# Patient Record
Sex: Female | Born: 1990
Health system: Southern US, Community
[De-identification: ages and names within clinical notes are randomized; demographics above are authoritative.]

---

## 2017-03-07 ENCOUNTER — Encounter (HOSPITAL_COMMUNITY): Payer: Self-pay | Admitting: Emergency Medicine

## 2017-03-07 ENCOUNTER — Emergency Department (HOSPITAL_COMMUNITY)
Admission: EM | Admit: 2017-03-07 | Discharge: 2017-03-07 | Disposition: A | Discharge status: Home / Self Care | Payer: Self-pay | Attending: Emergency Medicine | Admitting: Emergency Medicine

## 2017-03-07 DIAGNOSIS — J029 Acute pharyngitis, unspecified: Secondary | ICD-10-CM | POA: Insufficient documentation

## 2017-03-07 LAB — RAPID STREP SCREEN (MED CTR MEBANE ONLY): STREPTOCOCCUS, GROUP A SCREEN (DIRECT): NEGATIVE

## 2017-03-07 MED ORDER — DEXAMETHASONE 1 MG/ML PO CONC
10.0000 mg | Freq: Once | ORAL | Status: DC
  Filled 2017-03-07: qty 10

## 2017-03-07 MED ORDER — DEXAMETHASONE 4 MG PO TABS
10.0000 mg | ORAL_TABLET | Freq: Once | ORAL | Status: AC
  Administered 2017-03-07: 10 mg via ORAL
  Filled 2017-03-07: qty 3

## 2017-03-07 MED ORDER — PENICILLIN G BENZATHINE & PROC 1200000 UNIT/2ML IM SUSP: 1.2000 10*6.[IU] | Freq: Once | INTRAMUSCULAR | Status: DC

## 2017-03-07 MED ORDER — PENICILLIN G BENZATHINE 1200000 UNIT/2ML IM SUSP
1.2000 10*6.[IU] | Freq: Once | INTRAMUSCULAR | Status: AC
  Administered 2017-03-07: 1.2 10*6.[IU] via INTRAMUSCULAR
  Filled 2017-03-07: qty 2

## 2017-03-07 NOTE — ED Triage Notes (Signed)
Pt reports waking up with severely swollen tonsils this am, denies pain but states it is hard to breathe and talk.

## 2017-03-07 NOTE — ED Notes (Signed)
Pt is in stable condition upon d/c and ambulates from ED. 

## 2017-03-07 NOTE — ED Notes (Signed)
Pt is requesting crackers and ginger ale. Trey PaulaJeff, PA notified will switch from liquid med to pill rt liquid not arriving from pharmacy.

## 2017-03-07 NOTE — Discharge Instructions (Signed)
Please read attached information. If you experience any new or worsening signs or symptoms please return to the emergency room for evaluation. Please follow-up with your primary care provider or specialist as discussed.  °

## 2017-03-07 NOTE — ED Provider Notes (Signed)
[facility]-EMERGENCY DEPT Provider Note   CSN: [id number] Arrival date & time: 03/07/17  1001     History   Chief Complaint Chief Complaint  Patient presents with  . Sore Throat    HPI Jaime Walker is a 26 y.o. female.  HPI   26 year old female presents today with complaints of sore throat. Patient notes her last 4 days she's had sore throat with painful swallowing. She notes on day 1 she had a fever and was taking Tylenol Cold and flu. She notes the fever went away, symptoms started improved and had very minimal pain yesterday. She notes waking up this morning with extreme pain in her throat, painful swallowing. She denies any drooling or pooling of secretions, she does note a minor change in her voice. She denies any fever today. Patient notes that she's had minor upper respiratory congestion associated with sore throat, with no significant productive cough. Patient notes when she was younger she had reoccurring strep throat infections, but has not had any in the last year. She reports she is otherwise healthy with no significant history.  History reviewed. No pertinent past medical history.  There are no active problems to display for this patient.   History reviewed. No pertinent surgical history.  OB History    No data available       Home Medications    Prior to Admission medications   Medication Sig Start Date End Date Taking? Authorizing Provider  Phenylephrine-DM-GG-APAP (TYLENOL COLD/FLU SEVERE) 5-10-200-325 MG TABS Take 1 tablet by mouth 2 (two) times daily as needed. Flu symptoms   Yes [provider]    Family History No family history on file.  Social History Social History  Substance Use Topics  . Smoking status: Not on file  . Smokeless tobacco: Not on file  . Alcohol use Not on file     Allergies   Patient has no known allergies.   Review of Systems Review of Systems  All other systems reviewed and are negative.    Physical  Exam Updated Vital Signs BP 113/71   Pulse 79   Temp 98.5 F (36.9 C) (Oral)   Resp 14   LMP 02/21/2017 (Exact Date)   SpO2 98%   Physical Exam  Constitutional: She is oriented to person, place, and time. She appears well-developed and well-nourished.  HENT:  Head: Normocephalic and atraumatic.  Bilateral tonsillar swelling; symmetrical, no significant exudate- uvula is midline with no swelling rises with phonation, no signs of PTA or RPA. No pooling of secretions, no swelling to the tongue remainder of oral cavity  Eyes: Conjunctivae are normal. Pupils are equal, round, and reactive to light. Right eye exhibits no discharge. Left eye exhibits no discharge. No scleral icterus.  Neck: Normal range of motion. No JVD present. No tracheal deviation present.  Neck is supple for active range of motion, no palpable cervical lymphadenopathy  Pulmonary/Chest: Effort normal. No stridor.  Neurological: She is alert and oriented to person, place, and time. Coordination normal.  Psychiatric: She has a normal mood and affect. Her behavior is normal. Judgment and thought content normal.  Nursing note and vitals reviewed.    ED Treatments / Results  Labs (all labs ordered are listed, but only abnormal results are displayed) Labs Reviewed  RAPID STREP SCREEN (NOT AT [facility])  CULTURE, GROUP A STREP [facility])    EKG  EKG Interpretation None       Radiology No results found.  Procedures Procedures (including critical care time)  Medications Ordered in ED Medications  penicillin g benzathine (BICILLIN LA) 1200000 UNIT/2ML injection 1.2 Million Units (1.2 Million Units Intramuscular Given 03/07/17 1140)  dexamethasone (DECADRON) tablet 10 mg (10 mg Oral Given 03/07/17 1140)     Initial Impression / Assessment and Plan / ED Course  I have reviewed the triage vital signs and the nursing notes.  Pertinent labs & imaging results that were available during my care of the patient were reviewed by  me and considered in my medical decision making (see chart for details).      Final Clinical Impressions(s) / ED Diagnoses   Final diagnoses:  Pharyngitis, unspecified etiology    Labs: rapid strep  Imaging:  Consults:  Therapeutics: decadron, penicillin   Discharge Meds:   Assessment/Plan: 46 female presents today with sore throat. Patient initially had improvement of symptoms with dramatic worsening. She is afebrile nontoxic this time. She has no pooling of secretions, no asymmetrical swelling of the tonsils no signs of PTA or RPA. Given patient's worsening of symptoms, significant swelling Decadron and penicillin will be used for presumed bacterial pharyngitis. Patient given strict return precautions, she verbalized understanding and agreement to today's plan had no further questions or concerns. Patient also questioning whether she needs her tonsils removed due to recurrent infections, I referred her to ENT for further discussion.    New Prescriptions Discharge Medication List as of 03/07/2017 11:55 AM       Eyvonne Mechanic, PA-C 03/07/17 1553    Cardama, Amadeo Garnet, MD 03/11/17 [phone]

## 2017-03-09 LAB — CULTURE, GROUP A STREP (THRC)

## 2018-02-16 ENCOUNTER — Other Ambulatory Visit: Payer: Self-pay

## 2018-02-16 ENCOUNTER — Encounter (HOSPITAL_BASED_OUTPATIENT_CLINIC_OR_DEPARTMENT_OTHER): Payer: Self-pay

## 2018-02-16 ENCOUNTER — Emergency Department (HOSPITAL_BASED_OUTPATIENT_CLINIC_OR_DEPARTMENT_OTHER)
Admission: EM | Admit: 2018-02-16 | Discharge: 2018-02-16 | Disposition: A | Discharge status: Home / Self Care | Payer: No Typology Code available for payment source | Attending: Emergency Medicine | Admitting: Emergency Medicine

## 2018-02-16 ENCOUNTER — Emergency Department (HOSPITAL_BASED_OUTPATIENT_CLINIC_OR_DEPARTMENT_OTHER): Payer: No Typology Code available for payment source

## 2018-02-16 DIAGNOSIS — M25511 Pain in right shoulder: Secondary | ICD-10-CM

## 2018-02-16 DIAGNOSIS — Y998 Other external cause status: Secondary | ICD-10-CM | POA: Diagnosis not present

## 2018-02-16 DIAGNOSIS — S99911A Unspecified injury of right ankle, initial encounter: Secondary | ICD-10-CM | POA: Diagnosis present

## 2018-02-16 DIAGNOSIS — Y9389 Activity, other specified: Secondary | ICD-10-CM | POA: Insufficient documentation

## 2018-02-16 DIAGNOSIS — Y92410 Unspecified street and highway as the place of occurrence of the external cause: Secondary | ICD-10-CM | POA: Insufficient documentation

## 2018-02-16 DIAGNOSIS — T148XXA Other injury of unspecified body region, initial encounter: Secondary | ICD-10-CM

## 2018-02-16 DIAGNOSIS — S46911A Strain of unspecified muscle, fascia and tendon at shoulder and upper arm level, right arm, initial encounter: Secondary | ICD-10-CM | POA: Insufficient documentation

## 2018-02-16 DIAGNOSIS — S96911A Strain of unspecified muscle and tendon at ankle and foot level, right foot, initial encounter: Secondary | ICD-10-CM | POA: Diagnosis not present

## 2018-02-16 DIAGNOSIS — F17210 Nicotine dependence, cigarettes, uncomplicated: Secondary | ICD-10-CM | POA: Diagnosis not present

## 2018-02-16 MED ORDER — METHOCARBAMOL 500 MG PO TABS: 500.0000 mg | ORAL_TABLET | Freq: Two times a day (BID) | ORAL | 0 refills | Status: AC

## 2018-02-16 MED ORDER — HYDROCODONE-ACETAMINOPHEN 5-325 MG PO TABS
2.0000 | ORAL_TABLET | Freq: Once | ORAL | Status: AC
  Administered 2018-02-16: 2 via ORAL
  Filled 2018-02-16: qty 2

## 2018-02-16 MED FILL — METHOCARBAMOL 500 MG TABLET: 500 | 10 days supply | Qty: 20 | Fill #0
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
## 2018-02-16 NOTE — ED Notes (Signed)
ED Provider at bedside. 
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
## 2023-09-20 ENCOUNTER — Encounter (HOSPITAL_BASED_OUTPATIENT_CLINIC_OR_DEPARTMENT_OTHER): Payer: Self-pay

## 2023-09-20 ENCOUNTER — Other Ambulatory Visit (HOSPITAL_BASED_OUTPATIENT_CLINIC_OR_DEPARTMENT_OTHER): Payer: Self-pay

## 2023-09-20 ENCOUNTER — Other Ambulatory Visit: Payer: Self-pay

## 2023-09-20 ENCOUNTER — Emergency Department (HOSPITAL_BASED_OUTPATIENT_CLINIC_OR_DEPARTMENT_OTHER)
Admission: EM | Admit: 2023-09-20 | Discharge: 2023-09-20 | Disposition: A | Discharge status: Home / Self Care | Payer: Medicaid Other | Attending: Emergency Medicine | Admitting: Emergency Medicine

## 2023-09-20 DIAGNOSIS — K130 Diseases of lips: Secondary | ICD-10-CM | POA: Diagnosis present

## 2023-09-20 DIAGNOSIS — B001 Herpesviral vesicular dermatitis: Secondary | ICD-10-CM

## 2023-09-20 DIAGNOSIS — R220 Localized swelling, mass and lump, head: Secondary | ICD-10-CM

## 2023-09-20 DIAGNOSIS — F17210 Nicotine dependence, cigarettes, uncomplicated: Secondary | ICD-10-CM | POA: Insufficient documentation

## 2023-09-20 MED ORDER — ACYCLOVIR 400 MG PO TABS
800.0000 mg | ORAL_TABLET | Freq: Two times a day (BID) | ORAL | 1 refills | Status: AC
  Filled 2023-09-20: qty 20, 5d supply, fill #0

## 2023-09-20 MED ORDER — METHYLPREDNISOLONE 4 MG PO TBPK
ORAL_TABLET | ORAL | 0 refills | Status: AC
  Filled 2023-09-20: qty 21, 6d supply, fill #0

## 2023-09-20 MED ORDER — CETIRIZINE HCL 10 MG PO TABS
10.0000 mg | ORAL_TABLET | Freq: Every day | ORAL | 0 refills | Status: AC
  Filled 2023-09-20: qty 30, 30d supply, fill #0

## 2023-09-20 NOTE — ED Triage Notes (Signed)
She states she has had a "sore" on her upper lip for about three days. She denies fever, nor any other sign of current illness. She mentions that about a year ago "I got such a bad cold sore that they prescribed me Valcycovir".

## 2023-09-20 NOTE — Discharge Instructions (Addendum)
You have been prescribed antiviral medication called acyclovir for your presumed cold sore.  You are also started on steroids and antihistamine.  They should help reduce the swelling to your lip.  Please follow-up with your PCP for recheck for recurrent lip swelling.  Please return for any worsening worrisome symptoms such as worsening swelling, tongue swelling, difficulty swallowing or speaking, difficulty tolerating her own secretions, rash spreading to your face, or any other worrisome or worsening symptoms.

## 2023-09-20 NOTE — ED Provider Notes (Signed)
[place] EMERGENCY DEPARTMENT AT [facility] Provider Note  CSN: [id number] Arrival date & time: 09/20/23 0827  Chief Complaint(s) upper lip lesion/swelling  HPI Jaime Walker is a 32 y.o. female with past medical history as below, significant for recurrent lip swelling, cold sores who presents to the ED with complaint of possible cold sore, lip swelling  Patient ports URI symptoms over the past few days with dry cough, rhinorrhea.  Symptoms have been improving.  She noticed a cold sore to her left upper lip, then had some swelling to her left upper lip.  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  She has not seen a doctor for this previously.  She took some leftover Valtrex on Thursday when the cold sore and lip swelling began but she only had a couple tablets left.  She is not having any tongue swelling or difficulty swallowing, no difficulty breathing or speaking.  No fevers or chills, no rashes otherwise.  Past Medical History History reviewed. No pertinent past medical history. There are no active problems to display for this patient.  Home Medication(s) Prior to Admission medications   Medication Sig Start Date End Date Taking? Authorizing Provider  acyclovir (ZOVIRAX) 800 MG tablet Take 1 tablet (800 mg total) by mouth 2 (two) times daily for 5 days. 09/20/23 09/25/23 Yes Sloan Leiter, DO  cetirizine (ZYRTEC ALLERGY) 10 MG tablet Take 1 tablet (10 mg total) by mouth daily. 09/20/23 10/20/23 Yes Sloan Leiter, DO  methylPREDNISolone (MEDROL DOSEPAK) 4 MG TBPK tablet Per manufacturer's instructions 09/20/23  Yes Sloan Leiter, DO  methocarbamol (ROBAXIN) 500 MG tablet Take 1 tablet (500 mg total) by mouth 2 (two) times daily. 02/16/18   Maxwell Caul, PA-C  Phenylephrine-DM-GG-APAP (TYLENOL COLD/FLU SEVERE) 5-10-200-325 MG TABS Take 1 tablet by mouth 2 (two) times daily as needed. Flu symptoms    [provider]                                                                                                                                    Past Surgical History History reviewed. No pertinent surgical history. Family History History reviewed. No pertinent family history.  Social History Social History   Tobacco Use   Smoking status: Every Day    Types: Cigarettes   Smokeless tobacco: Never  Substance Use Topics   Alcohol use: Yes    Comment: occ   Drug use: Never   Allergies Patient has no known allergies.  Review of Systems Review of Systems  Constitutional:  Negative for chills and fever.  HENT:  Positive for congestion and facial swelling. Negative for trouble swallowing and voice change.   Respiratory:  Positive for cough.   Cardiovascular:  Negative for chest pain and palpitations.  Gastrointestinal:  Negative for abdominal pain and vomiting.  Neurological:  Negative for speech difficulty  and headaches.  All other systems reviewed and are negative.   Physical Exam Vital Signs  I have reviewed the triage vital signs BP 131/88 (BP Location: Left Arm)   Pulse 81   Temp 98 F (36.7 C) (Oral)   Resp 15   LMP 09/18/2023 (Exact Date)   SpO2 100%  Physical Exam Vitals and nursing note reviewed.  Constitutional:      General: She is not in acute distress.    Appearance: Normal appearance. She is well-developed. She is not ill-appearing.  HENT:     Head: Normocephalic and atraumatic. No raccoon eyes, Battle's sign, right periorbital erythema or left periorbital erythema.     Jaw: There is normal jaw occlusion. No trismus or swelling.     Comments: No drooling trismus stridor    Right Ear: External ear normal.     Left Ear: External ear normal.     Nose: Nose normal.     Mouth/Throat:     Mouth: Mucous membranes are moist. No oral lesions.     Tongue: No lesions. Tongue does not deviate from midline.     Pharynx: Oropharynx is clear. No pharyngeal swelling  or uvula swelling.     Comments: Slight swelling noted to upper lip, vesicular lesion noted to left upper lip. Eyes:     General: No scleral icterus.       Right eye: No discharge.        Left eye: No discharge.  Cardiovascular:     Rate and Rhythm: Normal rate.  Pulmonary:     Effort: Pulmonary effort is normal. No respiratory distress.     Breath sounds: No stridor.  Abdominal:     General: Abdomen is flat. There is no distension.     Tenderness: There is no guarding.  Musculoskeletal:        General: No deformity.     Cervical back: No rigidity.  Skin:    General: Skin is warm and dry.     Coloration: Skin is not cyanotic, jaundiced or pale.  Neurological:     Mental Status: She is alert and oriented to person, place, and time.     GCS: GCS eye subscore is 4. GCS verbal subscore is 5. GCS motor subscore is 6.  Psychiatric:        Speech: Speech normal.        Behavior: Behavior normal. Behavior is cooperative.     ED Results and Treatments Labs (all labs ordered are listed, but only abnormal results are displayed) Labs Reviewed - No data to display                                                                                                                        Radiology No results found.  Pertinent labs & imaging results that were available during my care of the patient were reviewed by me and considered in my medical decision making (see MDM for details).  Medications Ordered in ED  Medications - No data to display                                                                                                                                   Procedures Procedures  (including critical care time)  Medical Decision Making / ED Course    Medical Decision Making:    Jaime Walker is a 32 y.o. female with past medical history as below, significant for recurrent lip swelling, cold sores who presents to the ED with complaint of possible cold sore, lip swelling.  The complaint involves an extensive differential diagnosis and also carries with it a high risk of complications and morbidity.  Serious etiology was considered. Ddx includes but is not limited to: Herpes simplex, stomatitis, angioedema, hereditary angioedema, idiopathic angioedema, viral infection, etc.  Complete initial physical exam performed, notably the patient was in no acute distress, breathing comfortably.    Reviewed and confirmed nursing documentation for past medical history, family history, social history.  Vital signs reviewed.        Brief summary: 32 year old female history of recurrent lip swelling and cold sores here with recurrent episode of lip swelling and possible cold sore.  Symptom onset on Thursday, took Valtrex and symptoms persisted.  She presents today with ongoing lip swelling and possible cold sore.  Denies formal testing for herpes simplex in the past.  Denies any rashes otherwise no concern for STI.  Denies use of home medications including ACE or ARB.  Denies any Family history with similar swelling.  She reports that lip swelling occurs typically yearly oftentimes in conjunction with cold sore.  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  She is very well-appearing, nontoxic, no evidence of airway compromise, symptoms ongoing for approximately 4 days have not worsened.  Started on acyclovir, Decadron(denies pregnancy), and antihistamine, encourage follow-up with PCP.   The patient improved significantly and was discharged in stable condition. Detailed discussions were had with the patient regarding current findings, and need for close f/u with PCP or on call doctor. The patient has been instructed to return immediately if the symptoms worsen in any way for re-evaluation. Patient verbalized understanding and is in agreement with current care plan. All questions answered prior to discharge.                  Additional  history obtained: -Additional history obtained from na -External records from outside source obtained and reviewed including: Chart review including previous notes, labs, imaging, consultation notes including  Prior ED visits, primary care documentation, prior labs and imaging Was seen in the ED 12/23 with similar presentation   Lab Tests: na  EKG   EKG Interpretation Date/Time:    Ventricular Rate:    PR Interval:    QRS Duration:    QT Interval:    QTC Calculation:   R Axis:      Text Interpretation:  Imaging Studies ordered: na   Medicines ordered and prescription drug management: Meds ordered this encounter  Medications   acyclovir (ZOVIRAX) 800 MG tablet    Sig: Take 1 tablet (800 mg total) by mouth 2 (two) times daily for 5 days.    Dispense:  10 tablet    Refill:  1   methylPREDNISolone (MEDROL DOSEPAK) 4 MG TBPK tablet    Sig: Per manufacturer's instructions    Dispense:  21 each    Refill:  0   cetirizine (ZYRTEC ALLERGY) 10 MG tablet    Sig: Take 1 tablet (10 mg total) by mouth daily.    Dispense:  30 tablet    Refill:  0    -I have reviewed the patients home medicines and have made adjustments as needed   Consultations Obtained: na   Cardiac Monitoring: Continuous pulse oximetry interpreted by myself, 100% on RA.    Social Determinants of Health:   Diagnosis or treatment significantly limited by social determinants of health: current smoker and obesity no pcp   Reevaluation: After the interventions noted above, I reevaluated the patient and found that they have stayed the same  Co morbidities that complicate the patient evaluation History reviewed. No pertinent past medical history.    Dispostion: Disposition decision including need for hospitalization was considered, and patient discharged from emergency department.    Final Clinical Impression(s) / ED Diagnoses Final diagnoses:  Recurrent cold sores  Lip swelling         Sloan Leiter, DO 09/20/23 [phone]

## 2024-09-11 ENCOUNTER — Encounter (HOSPITAL_BASED_OUTPATIENT_CLINIC_OR_DEPARTMENT_OTHER): Payer: Self-pay

## 2024-09-11 ENCOUNTER — Other Ambulatory Visit: Payer: Self-pay

## 2024-09-11 ENCOUNTER — Other Ambulatory Visit (HOSPITAL_BASED_OUTPATIENT_CLINIC_OR_DEPARTMENT_OTHER): Payer: Self-pay

## 2024-09-11 ENCOUNTER — Emergency Department (HOSPITAL_BASED_OUTPATIENT_CLINIC_OR_DEPARTMENT_OTHER)
Admission: EM | Admit: 2024-09-11 | Discharge: 2024-09-11 | Disposition: A | Discharge status: Home / Self Care | Attending: Emergency Medicine | Admitting: Emergency Medicine

## 2024-09-11 DIAGNOSIS — Z760 Encounter for issue of repeat prescription: Secondary | ICD-10-CM | POA: Insufficient documentation

## 2024-09-11 DIAGNOSIS — Z7689 Persons encountering health services in other specified circumstances: Secondary | ICD-10-CM

## 2024-09-11 LAB — PREGNANCY, URINE: Preg Test, Ur: NEGATIVE

## 2024-09-11 MED ORDER — ULIPRISTAL ACETATE 30 MG PO TABS
1.0000 | ORAL_TABLET | Freq: Once | ORAL | 0 refills | Status: AC
  Filled 2024-09-11: qty 1, 1d supply, fill #0

## 2024-09-11 NOTE — ED Triage Notes (Addendum)
 Arrives ambulatory to the ED requesting emergency contraception due to intercourse with no protection 3-4 days ago. Patient attempted to get the medication at a pharmacy, but was unable to receive it without a prescription.

## 2024-09-11 NOTE — Discharge Instructions (Addendum)
 You were seen in the emergency room for request of emergency contraception.  We have prescribed you Ella , which can be picked up at [facility].  If there is any issues with the pick up, up let us  know immediately.  Our OB/GYN team will also likely contact you to discuss ongoing contraceptive use.

## 2024-09-13 ENCOUNTER — Telehealth (HOSPITAL_BASED_OUTPATIENT_CLINIC_OR_DEPARTMENT_OTHER): Payer: Self-pay

## 2024-09-13 NOTE — Telephone Encounter (Signed)
 Left a message for patient to schedule a follow up from her ED visit. Asked her to call the office to schedule [phone].

## 2024-10-04 NOTE — ED Provider Notes (Signed)
 " [place] EMERGENCY DEPARTMENT AT [facility] Provider Note   CSN: [id number] Arrival date & time: 09/11/24  9049     Patient presents with: Emergency Contraception   Jaime Walker is a 33 y.o. female.   HPI  Pt comes in requesting plan B. Pt reports that she had intercourse 3 days ago. The protection didn't work. She is concerned that she might get pregnant and is requesting plan B. She did get over the counter plan B, but it states that the medicine might not be effective above her weight. [facility] another plan B, but when she went to her pharmacy there was no medicine ordered, and therefore she comes to the ER.     Prior to Admission medications  Medication Sig Start Date End Date Taking? Authorizing Provider  cetirizine  (ZYRTEC  ALLERGY) 10 MG tablet Take 1 tablet (10 mg total) by mouth daily. 09/20/23 10/20/23  Elnor Jayson LABOR, DO  methocarbamol  (ROBAXIN ) 500 MG tablet Take 1 tablet (500 mg total) by mouth 2 (two) times daily. 02/16/18   Delorise Morna LABOR, PA-C  methylPREDNISolone  (MEDROL  DOSEPAK) 4 MG TBPK tablet Per manufacturer's instructions 09/20/23   Elnor Jayson A, DO  Phenylephrine-DM-GG-APAP (TYLENOL  COLD/FLU SEVERE) 5-10-200-325 MG TABS Take 1 tablet by mouth 2 (two) times daily as needed. Flu symptoms    [provider]    Allergies: Patient has no known allergies.    Review of Systems  All other systems reviewed and are negative.   Updated Vital Signs BP (!) 147/104 (BP Location: Right Arm)   Pulse 99   Temp 98.6 F (37 C) (Oral)   Resp 20   Ht 5' (1.524 m)   Wt 96.6 kg   SpO2 99%   BMI 41.60 kg/m   Physical Exam Vitals and nursing note reviewed.  HENT:     Head: Normocephalic and atraumatic.  Cardiovascular:     Rate and Rhythm: Normal rate.  Pulmonary:     Effort: Pulmonary effort is normal.  Musculoskeletal:     Cervical back: Normal range of motion.  Neurological:      Mental Status: She is alert.     (all labs ordered are listed, but only abnormal results are displayed) Labs Reviewed  PREGNANCY, URINE    EKG: None  Radiology: No results found.   Procedures   Medications Ordered in the ED - No data to display                                  Medical Decision Making Amount and/or Complexity of Data Reviewed Labs: ordered.  Risk Prescription drug management.   Pt comes in close to 72 hours after intercourse and is requesting plan B. She bought OTC medicine, but the manufacturer suggested poor effectiveness for her weight. She went to plan B and now eventually here.  Ella  will be prescribed and it is available at [facility] pharmacy.  Stable for d/c. Case discussed with gyne team, whose help is appreciated.    Final diagnoses:  Encounter for new medication prescription    ED Discharge Orders          Ordered    ulipristal acetate  (ELLA ) 30 MG tablet   Once        09/11/24 1042               Charlyn Sora, MD 10/04/24 1451  "
